# Patient Record
Sex: Male | Born: 2000 | Hispanic: No | Marital: Single | Smoking: Never smoker
Health system: Southern US, Community
[De-identification: ages and names within clinical notes are randomized; demographics above are authoritative.]

## PROBLEM LIST (undated history)

## (undated) HISTORY — PX: EYE SURGERY: SHX253

---
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
## 2019-04-04 ENCOUNTER — Other Ambulatory Visit: Payer: Self-pay

## 2019-04-04 DIAGNOSIS — Z20822 Contact with and (suspected) exposure to covid-19: Secondary | ICD-10-CM

## 2019-04-07 LAB — NOVEL CORONAVIRUS, NAA: SARS-CoV-2, NAA: NOT DETECTED

## 2019-04-21 ENCOUNTER — Ambulatory Visit: Payer: HRSA Program | Attending: Internal Medicine

## 2019-04-21 DIAGNOSIS — Z20822 Contact with and (suspected) exposure to covid-19: Secondary | ICD-10-CM

## 2019-04-21 DIAGNOSIS — Z20828 Contact with and (suspected) exposure to other viral communicable diseases: Secondary | ICD-10-CM | POA: Diagnosis present

## 2019-04-22 ENCOUNTER — Other Ambulatory Visit: Payer: Self-pay

## 2019-04-22 LAB — NOVEL CORONAVIRUS, NAA: SARS-CoV-2, NAA: NOT DETECTED

## 2019-12-24 ENCOUNTER — Ambulatory Visit
Admission: RE | Admit: 2019-12-24 | Discharge: 2019-12-24 | Disposition: A | Discharge status: Home / Self Care | Payer: Self-pay | Source: Ambulatory Visit

## 2019-12-24 VITALS — BP 138/79 | HR 93 | Temp 99.1°F | Resp 18

## 2019-12-24 DIAGNOSIS — R369 Urethral discharge, unspecified: Secondary | ICD-10-CM | POA: Insufficient documentation

## 2019-12-24 NOTE — Discharge Instructions (Addendum)
Cytology sent, you will be contacted with any positive results that requires further treatment. Refrain from sexual activity  for the next 7 days.  If developing testicular swelling/pain, penile lesion/sore, follow up for reevaluation.   If testing is negative, but still having discharge, please follow-up with urology for further evaluation.

## 2019-12-24 NOTE — ED Provider Notes (Signed)
[facility]    CSN: [id number] Arrival date & time: 12/24/19  1242      History   Chief Complaint Chief Complaint  Patient presents with  . Penile Discharge    HPI GEDALYA JIM is a 19 y.o. male.   19 year old male comes in for 2 day history of penile discharge. Denies fever, chills, body aches. Denies abdominal pain, nausea, vomiting. Denies urinary symptoms such as frequency, dysuria, hematuria. Denies penile sore/ulcer, testicular swelling, testicular pain. Sexually active with one male partner.       History reviewed. No pertinent past medical history.  There are no problems to display for this patient.   Past Surgical History:  Procedure Laterality Date  . EYE SURGERY         Home Medications    Prior to Admission medications   Medication Sig Start Date End Date Taking? Authorizing Provider  mupirocin ointment (BACTROBAN) 2 % Place 1 application into the nose 2 (two) times daily. 10/21/15   Weber, Dema Severin, PA-C  triamcinolone cream (KENALOG) 0.1 % Apply 1 application topically 2 (two) times daily. Patient not taking: Reported on 10/21/2015 01/03/15   Tonye Pearson, MD  fexofenadine [facility]) 60 MG tablet Take by mouth.  12/24/19  [provider]    Family History Family History  Problem Relation Age of Onset  . Diabetes Mother     Social History Social History   Tobacco Use  . Smoking status: Never Smoker  . Smokeless tobacco: Never Used  Vaping Use  . Vaping Use: Never used  Substance Use Topics  . Alcohol use: No  . Drug use: Yes    Types: Marijuana     Allergies   Patient has no known allergies.   Review of Systems Review of Systems  Reason unable to perform ROS: See HPI as above.     Physical Exam Triage Vital Signs ED Triage Vitals  Enc Vitals Group     BP 12/24/19 1333 138/79     Pulse Rate 12/24/19 1333 93     Resp 12/24/19 1333 18     Temp 12/24/19 1333 99.1 F (37.3 C)     Temp  Source 12/24/19 1333 Oral     SpO2 12/24/19 1333 97 %     Weight --      Height --      Head Circumference --      Peak Flow --      Pain Score 12/24/19 1400 0     Pain Loc --      Pain Edu? --      Excl. in GC? --    No data found.  Updated Vital Signs BP 138/79 (BP Location: Left Arm)   Pulse 93   Temp 99.1 F (37.3 C) (Oral)   Resp 18   SpO2 97%   Physical Exam Constitutional:      General: He is not in acute distress.    Appearance: Normal appearance. He is well-developed. He is not toxic-appearing or diaphoretic.  HENT:     Head: Normocephalic and atraumatic.  Eyes:     Conjunctiva/sclera: Conjunctivae normal.     Pupils: Pupils are equal, round, and reactive to light.  Pulmonary:     Effort: Pulmonary effort is normal. No respiratory distress.  Musculoskeletal:     Cervical back: Normal range of motion and neck supple.  Skin:    General: Skin is warm and dry.  Neurological:  Mental Status: He is alert and oriented to person, place, and time.      UC Treatments / Results  Labs (all labs ordered are listed, but only abnormal results are displayed) Labs Reviewed  CYTOLOGY, (ORAL, ANAL, URETHRAL) ANCILLARY ONLY    EKG   Radiology No results found.  Procedures Procedures (including critical care time)  Medications Ordered in UC Medications - No data to display  Initial Impression / Assessment and Plan / UC Course  I have reviewed the triage vital signs and the nursing notes.  Pertinent labs & imaging results that were available during my care of the patient were reviewed by me and considered in my medical decision making (see chart for details).    Cytology sent, patient will be contacted with any positive results that require additional treatment. Patient to refrain from sexual activity for the next 7 days. Return precautions given.   Final Clinical Impressions(s) / UC Diagnoses   Final diagnoses:  Penile discharge    ED Prescriptions      None     PDMP not reviewed this encounter.   Belinda Fisher, PA-C 12/24/19 1442

## 2019-12-24 NOTE — ED Triage Notes (Signed)
Pt c/o penile discharge yellow in nature for approx 2 days. Denies burning with urination, abdominal pain, n/v/d, fever, chills or rash to genital area.

## 2019-12-25 LAB — CYTOLOGY, (ORAL, ANAL, URETHRAL) ANCILLARY ONLY
Chlamydia: NEGATIVE
Comment: NEGATIVE
Comment: NEGATIVE
Comment: NORMAL
Neisseria Gonorrhea: NEGATIVE
Trichomonas: NEGATIVE

## 2020-03-23 ENCOUNTER — Ambulatory Visit
Admission: RE | Admit: 2020-03-23 | Discharge: 2020-03-23 | Disposition: A | Discharge status: Home / Self Care | Payer: Self-pay | Source: Ambulatory Visit | Attending: Emergency Medicine | Admitting: Emergency Medicine

## 2020-03-23 ENCOUNTER — Other Ambulatory Visit: Payer: Self-pay

## 2020-03-23 VITALS — BP 143/87 | HR 92 | Temp 98.4°F | Resp 18

## 2020-03-23 DIAGNOSIS — Z202 Contact with and (suspected) exposure to infections with a predominantly sexual mode of transmission: Secondary | ICD-10-CM | POA: Insufficient documentation

## 2020-03-23 LAB — POCT URINALYSIS DIP (MANUAL ENTRY)
Bilirubin, UA: NEGATIVE
Glucose, UA: NEGATIVE mg/dL
Ketones, POC UA: NEGATIVE mg/dL
Leukocytes, UA: NEGATIVE
Nitrite, UA: NEGATIVE
Protein Ur, POC: NEGATIVE mg/dL
Spec Grav, UA: 1.025
Urobilinogen, UA: 0.2 U/dL
pH, UA: 5.5

## 2020-03-23 MED ORDER — VALACYCLOVIR HCL 1 G PO TABS: 1000.0000 mg | ORAL_TABLET | Freq: Three times a day (TID) | ORAL | 0 refills | Status: AC

## 2020-03-23 NOTE — Discharge Instructions (Addendum)
Take Valtrex three times daily for seven days.  Your have blood test for syphilis, HSV 2 (genital herpes) and HIV in process at this time. We are also checking her urine for a UTI. Please take Valtrex 3 times daily for the next 7 days.

## 2020-03-23 NOTE — ED Provider Notes (Signed)
____________________________________________  Time seen: Approximately 4:35 PM  I have reviewed the triage vital signs and the nursing notes.   HISTORY  Chief Complaint SEXUALLY TRANSMITTED DISEASE (symptomatic x 2 weeks)   Historian Patient   HPI Paul Leach is a 19 y.o. male presents to the urgent care with the papular, erythematous vesicular rash along with penis for the past 2.  Rash is improving but has persisted.  Patient has had unprotected sex with 2 partners this year.  He denies penile discharge or dysuria.  He denies a prodrome of burning or pruritus.  He denies experiencing similar symptoms in the past.  To his knowledge, no sexual partners have experienced similar symptoms.  He is requesting a blood test for genital herpes.   History reviewed. No pertinent past medical history.   Immunizations up to date:  Yes.     History reviewed. No pertinent past medical history.  There are no problems to display for this patient.   Past Surgical History:  Procedure Laterality Date  . EYE SURGERY      Prior to Admission medications   Medication Sig Start Date End Date Taking? Authorizing Provider  mupirocin ointment (BACTROBAN) 2 % Place 1 application into the nose 2 (two) times daily. 10/21/15   Weber, Dema Severin, PA-C  triamcinolone cream (KENALOG) 0.1 % Apply 1 application topically 2 (two) times daily. Patient not taking: Reported on 10/21/2015 01/03/15   Tonye Pearson, MD  valACYclovir (VALTREX) 1000 MG tablet Take 1 tablet (1,000 mg total) by mouth 3 (three) times daily for 7 days. 03/23/20 03/30/20  Orvil Feil, PA-C  fexofenadine (ALLEGRA) 60 MG tablet Take by mouth.  12/24/19  [provider]    Allergies Patient has no known allergies.  Family History  Problem Relation Age of Onset  . Diabetes Mother     Social History Social History   Tobacco Use  . Smoking status: Never Smoker  . Smokeless tobacco: Never Used  Vaping Use   . Vaping Use: Never used  Substance Use Topics  . Alcohol use: No  . Drug use: Yes    Types: Marijuana     Review of Systems  Constitutional: No fever/chills Eyes:  No discharge ENT: No upper respiratory complaints. Respiratory: no cough. No SOB/ use of accessory muscles to breath Gastrointestinal:   No nausea, no vomiting.  No diarrhea.  No constipation. Musculoskeletal: Negative for musculoskeletal pain. Skin: Patient has penile rash.     ____________________________________________   PHYSICAL EXAM:  VITAL SIGNS: ED Triage Vitals  Enc Vitals Group     BP 03/23/20 1603 (!) 143/87     Pulse Rate 03/23/20 1603 92     Resp 03/23/20 1603 18     Temp 03/23/20 1603 98.4 F (36.9 C)     Temp Source 03/23/20 1603 Oral     SpO2 03/23/20 1603 99 %     Weight --      Height --      Head Circumference --      Peak Flow --      Pain Score 03/23/20 1604 0     Pain Loc --      Pain Edu? --      Excl. in GC? --      Constitutional: Alert and oriented. Well appearing and in no acute distress. Eyes: Conjunctivae are normal. PERRL. EOMI. Head: Atraumatic. ENT:      Nose: No congestion/rhinnorhea.      Mouth/Throat: Mucous  membranes are moist.  Neck: No stridor.  No cervical spine tenderness to palpation. Cardiovascular: Normal rate, regular rhythm. Normal S1 and S2.  Good peripheral circulation. Respiratory: Normal respiratory effort without tachypnea or retractions. Lungs CTAB. Good air entry to the bases with no decreased or absent breath sounds Gastrointestinal: Bowel sounds x 4 quadrants. Soft and nontender to palpation. No guarding or rigidity. No distention. Musculoskeletal: Full range of motion to all extremities. No obvious deformities noted Neurologic:  Normal for age. No gross focal neurologic deficits are appreciated.  Skin: Patient has an erythematous vesicular, papular rash along penis. Psychiatric: Mood and affect are normal for age. Speech and behavior are  normal.   ____________________________________________   LABS (all labs ordered are listed, but only abnormal results are displayed)  Labs Reviewed  URINE CULTURE  RPR  HSV 2 ANTIBODY, IGG  HIV ANTIBODY (ROUTINE TESTING W REFLEX)  POCT URINALYSIS DIP (MANUAL ENTRY)  CYTOLOGY, (ORAL, ANAL, URETHRAL) ANCILLARY ONLY   ____________________________________________  EKG   ____________________________________________  RADIOLOGY   No results found.  ____________________________________________    PROCEDURES  Procedure(s) performed:     Procedures     Medications - No data to display   ____________________________________________   INITIAL IMPRESSION / ASSESSMENT AND PLAN / ED COURSE  Pertinent labs & imaging results that were available during my care of the patient were reviewed by me and considered in my medical decision making (see chart for details).     Assessment and Plan:  STD exposure 19 year old male presents to the urgent care with an erythematous, papular, vesicular rash along penis that has been present for the past 2 weeks.  Clinically rash resembles genital herpes.  Patient requested confirmatory testing.  As vesicles are currently flaccid, elected to conduct HSV 2 serology testing.  Patient also elected to undergo testing for HIV, gonorrhea and chlamydia.  Urinalysis and culture in process at this time.  Relayed that testing results with post to patient's MyChart and we will help facilitate patient's care plan according to those results.  Patient was started on Valtrex empirically.  All patient questions were answered.   ____________________________________________  FINAL CLINICAL IMPRESSION(S) / ED DIAGNOSES  Final diagnoses:  STD exposure      NEW MEDICATIONS STARTED DURING THIS VISIT:  ED Discharge Orders         Ordered    valACYclovir (VALTREX) 1000 MG tablet  3 times daily        03/23/20 1629              This chart  was dictated using voice recognition software/Dragon. Despite best efforts to proofread, errors can occur which can change the meaning. Any change was purely unintentional.     Orvil Feil, PA-C 03/23/20 1644

## 2020-03-23 NOTE — ED Triage Notes (Signed)
Pt states he has had bumps on his penis that are now resolving and they began about 2 weeks ago. Pt is now concerned about STI's and would like to be tested. Pt is aox4 and ambulatory.

## 2020-03-24 LAB — RPR: RPR Ser Ql: NONREACTIVE

## 2020-03-24 LAB — HIV ANTIBODY (ROUTINE TESTING W REFLEX): HIV Screen 4th Generation wRfx: NONREACTIVE

## 2020-03-24 LAB — HSV 2 ANTIBODY, IGG: HSV 2 IgG, Type Spec: <0.91 index (ref 0.00–0.90)

## 2020-03-25 LAB — URINE CULTURE: Culture: <10000 — AB

## 2020-03-26 LAB — CYTOLOGY, (ORAL, ANAL, URETHRAL) ANCILLARY ONLY
Chlamydia: NEGATIVE
Comment: NEGATIVE
Comment: NEGATIVE
Comment: NORMAL
Neisseria Gonorrhea: NEGATIVE
Trichomonas: NEGATIVE

## 2021-06-13 ENCOUNTER — Other Ambulatory Visit: Payer: Self-pay

## 2021-06-13 ENCOUNTER — Ambulatory Visit (HOSPITAL_COMMUNITY)
Admission: EM | Admit: 2021-06-13 | Discharge: 2021-06-13 | Disposition: A | Discharge status: Home / Self Care | Payer: Self-pay | Attending: Family Medicine | Admitting: Family Medicine

## 2021-06-13 ENCOUNTER — Encounter (HOSPITAL_COMMUNITY): Payer: Self-pay

## 2021-06-13 DIAGNOSIS — K121 Other forms of stomatitis: Secondary | ICD-10-CM

## 2021-06-13 MED ORDER — ONDANSETRON 4 MG PO TBDP: 4.0000 mg | ORAL_TABLET | Freq: Three times a day (TID) | ORAL | 0 refills | Status: AC | PRN

## 2021-06-13 MED ORDER — CHLORHEXIDINE GLUCONATE 0.12 % MT SOLN: 15.0000 mL | Freq: Two times a day (BID) | OROMUCOSAL | 0 refills | Status: AC

## 2021-06-13 NOTE — ED Triage Notes (Signed)
Pt presents with complaints of fever, chills, ulcers in mouth, wisdom teeth pain. Reports going to dentist this am and being told he possibly has a virus in his mouth.

## 2021-06-15 NOTE — ED Provider Notes (Signed)
°  [facility]   [id number] 06/13/21 Arrival Time: 1346  ASSESSMENT & PLAN:  1. Oral ulcer    Oral ulcers likely to viral illness. No signs of thrush or bacterial infection. Has been seen by dentist this morning. With nausea; no emesis. Question beginning of early viral illness. Begin: Discharge Medication List as of 06/13/2021  3:40 PM     START taking these medications   Details  chlorhexidine (PERIDEX) 0.12 % solution Use as directed 15 mLs in the mouth or throat 2 (two) times daily., Starting Mon 06/13/2021, Normal    ondansetron (ZOFRAN-ODT) 4 MG disintegrating tablet Take 1 tablet (4 mg total) by mouth every 8 (eight) hours as needed for nausea or vomiting., Starting Mon 06/13/2021, Normal         Follow-up Information     [facility] EMERGENCY DEPARTMENT .   Specialty: Emergency Medicine Why: If symptoms worsen in any way. Contact information: [address] [id number] mc [place] [address] [phone]                Reviewed expectations re: course of current medical issues. Questions answered. Outlined signs and symptoms indicating need for more acute intervention. Understanding verbalized. After Visit Summary given.   SUBJECTIVE: History from: Patient. Paul Leach is a 21 y.o. male. Reports: oral ulcerations; very mild discomfort but mostly painful. Denies: fever, difficulty breathing, headache, and jaw pain. Normal PO intake without n/v/d. Saw his dentist this am; told likely viral.  OBJECTIVE:  Vitals:   06/13/21 1511  BP: (!) 149/70  Pulse: 94  Resp: 19  Temp: (!) 100.4 F (38 C)  SpO2: 100%    General appearance: alert; no distress Eyes: PERRLA; EOMI; conjunctiva normal HENT: [place]; AT; without nasal congestion; several scattered oral ulcers on inner mucosa mostly; a few on tongue; no swelling; normal uvula Neck: supple without LAD Lungs: speaks full sentences without difficulty;  unlabored Extremities: no edema Skin: warm and dry Neurologic: normal gait Psychological: alert and cooperative; normal mood and affect    Allergies  Allergen Reactions   Amoxicillin    Penicillins     History reviewed. No pertinent past medical history. Social History   Socioeconomic History   Marital status: Single    Spouse name: Not on file   Number of children: Not on file   Years of education: Not on file   Highest education level: Not on file  Occupational History   Not on file  Tobacco Use   Smoking status: Never   Smokeless tobacco: Never  Vaping Use   Vaping Use: Never used  Substance and Sexual Activity   Alcohol use: No   Drug use: Yes    Types: Marijuana   Sexual activity: Yes  Other Topics Concern   Not on file  Social History Narrative   Not on file   Social Determinants of Health   Financial Resource Strain: Not on file  Food Insecurity: Not on file  Transportation Needs: Not on file  Physical Activity: Not on file  Stress: Not on file  Social Connections: Not on file  Intimate Partner Violence: Not on file   Family History  Problem Relation Age of Onset   Diabetes Mother    Healthy Father    Past Surgical History:  Procedure Laterality Date   EYE SURGERY       Mardella Layman, MD 06/15/21 [phone]

## 2022-07-08 ENCOUNTER — Emergency Department (HOSPITAL_COMMUNITY): Payer: Medicaid Other

## 2022-07-08 ENCOUNTER — Emergency Department (HOSPITAL_COMMUNITY)
Admission: EM | Admit: 2022-07-08 | Discharge: 2022-07-08 | Disposition: A | Discharge status: Home / Self Care | Payer: Medicaid Other | Attending: Emergency Medicine | Admitting: Emergency Medicine

## 2022-07-08 ENCOUNTER — Other Ambulatory Visit: Payer: Self-pay

## 2022-07-08 DIAGNOSIS — S0181XA Laceration without foreign body of other part of head, initial encounter: Secondary | ICD-10-CM | POA: Insufficient documentation

## 2022-07-08 DIAGNOSIS — S0990XA Unspecified injury of head, initial encounter: Secondary | ICD-10-CM | POA: Diagnosis present

## 2022-07-08 DIAGNOSIS — Y92410 Unspecified street and highway as the place of occurrence of the external cause: Secondary | ICD-10-CM | POA: Insufficient documentation

## 2022-07-08 DIAGNOSIS — S60221A Contusion of right hand, initial encounter: Secondary | ICD-10-CM | POA: Insufficient documentation

## 2022-07-08 MED ORDER — CEPHALEXIN 500 MG PO CAPS: 500.0000 mg | ORAL_CAPSULE | Freq: Four times a day (QID) | ORAL | 0 refills | Status: AC

## 2022-07-08 NOTE — ED Notes (Signed)
Ice pack applied to face
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
